# Patient Record
Sex: Male | Born: 1970 | Race: Black or African American | Hispanic: No | Marital: Married | Smoking: Current every day smoker
Health system: Southern US, Community
[De-identification: ages and names within clinical notes are randomized; demographics above are authoritative.]

## PROBLEM LIST (undated history)

## (undated) HISTORY — PX: OTHER SURGICAL HISTORY: SHX169

---

## 2020-01-31 ENCOUNTER — Emergency Department (HOSPITAL_COMMUNITY)
Admission: EM | Admit: 2020-01-31 | Discharge: 2020-01-31 | Disposition: A | Discharge status: Home / Self Care | Payer: Medicaid - Out of State | Attending: Emergency Medicine | Admitting: Emergency Medicine

## 2020-01-31 ENCOUNTER — Encounter (HOSPITAL_COMMUNITY): Payer: Self-pay

## 2020-01-31 ENCOUNTER — Emergency Department (HOSPITAL_COMMUNITY): Payer: Medicaid - Out of State

## 2020-01-31 ENCOUNTER — Other Ambulatory Visit: Payer: Self-pay

## 2020-01-31 DIAGNOSIS — M25561 Pain in right knee: Secondary | ICD-10-CM | POA: Insufficient documentation

## 2020-01-31 DIAGNOSIS — F17200 Nicotine dependence, unspecified, uncomplicated: Secondary | ICD-10-CM | POA: Insufficient documentation

## 2020-01-31 MED ORDER — MELOXICAM 15 MG PO TABS
15.0000 mg | ORAL_TABLET | Freq: Once | ORAL | Status: AC
  Administered 2020-01-31: 15 mg via ORAL
  Filled 2020-01-31: qty 1

## 2020-01-31 MED ORDER — MELOXICAM 15 MG PO TABS: 15.0000 mg | ORAL_TABLET | Freq: Every day | ORAL | 0 refills | Status: DC

## 2020-01-31 MED ORDER — HYDROCODONE-ACETAMINOPHEN 5-325 MG PO TABS
1.0000 | ORAL_TABLET | Freq: Once | ORAL | Status: DC
  Filled 2020-01-31: qty 1

## 2020-01-31 MED ORDER — HYDROCODONE-ACETAMINOPHEN 5-325 MG PO TABS: 1.0000 | ORAL_TABLET | ORAL | 0 refills | Status: DC | PRN

## 2020-01-31 NOTE — ED Provider Notes (Signed)
[facility]-EMERGENCY DEPT Provider Note   CSN: [id number] Arrival date & time: 01/31/20  0840    History Chief Complaint  Patient presents with   Knee Pain    Mathew Huerta is a 49 y.o. male with past medical history who presents for evaluation of knee pain.  Patient with knee pain began greater than 1 month ago.  Patient states this has worsened over the last 3 weeks.  Has noticed intermittent swelling to his right knee.  It does stand on his feet and do a lot of bending.  States he primarily follows in [place] however is here visiting a friend.  No history of urinary complaints, IV drug use.  No fever, chills, nausea, vomiting.  He has not had any surrounding erythema or warmth.  He has been ambulatory without difficulty.  Pain located to lateral aspect knee.  No pain to popliteal fossa.  Does not extend into his calf.  Is not noticed any unilateral leg swelling, redness or warmth.  No prior history of PE or DVT.  No chest pain or shortness of breath.  Patient states he was previously seen by orthopedics and told he had arthritis.  He has been taking Tylenol at home without relief of his pain.  Was unable to sleep last night due to the "aching" so he presented here to emergency department for evaluation.  Unsure of history of gout.  Rates his pain a 8/10.  Denies additional aggravating or relieving factors.  History obtained from patient and past medical records.  No interpreter used.  HPI     History reviewed. No pertinent past medical history.  There are no problems to display for this patient.   History reviewed. No pertinent surgical history.     History reviewed. No pertinent family history.  Social History   Tobacco Use   Smoking status: Current Every Day Smoker   Smokeless tobacco: Never Used   Tobacco comment: x1 pack week  Substance Use Topics   Alcohol use: Not Currently   Drug use: Never    Home Medications Prior to Admission  medications   Medication Sig Start Date End Date Taking? Authorizing Provider  HYDROcodone-acetaminophen (NORCO/VICODIN) 5-325 MG tablet Take 1 tablet by mouth every 4 (four) hours as needed. 01/31/20   Kayl Stogdill A, PA-C  meloxicam (MOBIC) 15 MG tablet Take 1 tablet (15 mg total) by mouth daily. 01/31/20   Arron Tetrault A, PA-C    Allergies    Codeine, Peanut-containing drug products, and Tomato  Review of Systems   Review of Systems  Constitutional: Negative.   HENT: Negative.   Respiratory: Negative.   Cardiovascular: Negative.   Gastrointestinal: Negative.   Genitourinary: Negative.   Musculoskeletal: Negative for gait problem.       Right knee pain  Skin: Negative.   Neurological: Negative.   All other systems reviewed and are negative.   Physical Exam Updated Vital Signs BP (!) 176/96    Pulse 71    Temp 97.8 F (36.6 C) (Oral)    Resp 19    Ht 5\' 11"  (1.803 m)    Wt (!) 140.2 kg    SpO2 98%    BMI 43.10 kg/m   Physical Exam Vitals and nursing note reviewed.  Constitutional:      General: He is not in acute distress.    Appearance: He is well-developed. He is not ill-appearing, toxic-appearing or diaphoretic.  HENT:     Head: Normocephalic and atraumatic.  Nose: Nose normal.     Mouth/Throat:     Mouth: Mucous membranes are moist.  Eyes:     Pupils: Pupils are equal, round, and reactive to light.  Cardiovascular:     Rate and Rhythm: Normal rate and regular rhythm.     Pulses: Normal pulses.          Dorsalis pedis pulses are 2+ on the right side and 2+ on the left side.       Posterior tibial pulses are 2+ on the right side and 2+ on the left side.     Heart sounds: Normal heart sounds.  Pulmonary:     Effort: Pulmonary effort is normal. No respiratory distress.     Breath sounds: Normal breath sounds.  Abdominal:     General: Bowel sounds are normal. There is no distension.     Palpations: Abdomen is soft.  Musculoskeletal:        General:  Normal range of motion.     Cervical back: Normal range of motion and neck supple.     Right upper leg: Normal.     Left upper leg: Normal.     Right knee: Effusion present.     Right lower leg: Normal.     Left lower leg: Normal.       Legs:     Comments: Pelvis stable, nontender palpation.  Tenderness to medial lateral joint line to right knee.  There is mild suprapatellar joint effusion.  Able to flex and extend without difficulty.  No bony tenderness.  Able to straight leg raise bilaterally.  No tenderness to popliteal fossa.  Compartments soft.  Denna Haggard' sign negative.  Calf nontender without edema, erythema or warmth.  Skin:    General: Skin is warm and dry.     Capillary Refill: Capillary refill takes less than 2 seconds.     Comments: Suprapatellar joint effusion to right knee.  No erythema or warmth.  No rashes or lesions.  Neurological:     Mental Status: He is alert.     Cranial Nerves: Cranial nerves are intact.     Sensory: Sensation is intact.     Motor: Motor function is intact.     Coordination: Coordination is intact.     Gait: Gait is intact.     Comments: Ambulatory that difficulty 5/5 strength bilaterally Intact sensation    ED Results / Procedures / Treatments   Labs (all labs ordered are listed, but only abnormal results are displayed) Labs Reviewed - No data to display  EKG None  Radiology DG Knee Complete 4 Views Right  Result Date: 01/31/2020 CLINICAL DATA:  Knee pain for 2 weeks.  No injury. EXAM: RIGHT KNEE - COMPLETE 4+ VIEW COMPARISON:  None. FINDINGS: No evidence of fracture, dislocation. Suprapatellar effusion is noted. Mild narrow femoral tibial joint space is identified. Soft tissues are unremarkable. IMPRESSION: No acute fracture or dislocation noted. 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 Electronically Signed   By: Sherian Rein M.D.   On: 01/31/2020 10:08    Procedures Procedures (including critical care time)  Medications  Ordered in ED Medications  HYDROcodone-acetaminophen (NORCO/VICODIN) 5-325 MG per tablet 1 tablet (1 tablet Oral Refused 01/31/20 1150)  meloxicam (MOBIC) tablet 15 mg (15 mg Oral Given 01/31/20 1150)    ED Course  I have reviewed the triage vital signs and the nursing notes.  Pertinent labs & imaging results that were available during my care of the patient were reviewed by  me and considered in my medical decision making (see chart for details).  49 year old presents for evaluation of right knee pain.  He is afebrile, nonseptic, not ill-appearing.  Has history of chronic right knee pain was told previously this was due to arthritis.  Has noticed some swelling to his right knee over the last 3 weeks.  No recent injury or trauma.  No associated redness or warmth.  He is neurovascularly intact.  Compartments soft.  No clinical evidence of DVT on exam.  Patient does have tenderness to medial lateral joint line.  He has full range of motion to bilateral knees without any increased pain.  He does not have history of IV drug use, urinary complaints, history of gout.  Plain film obtained from triage did not show evidence of fracture, dislocation.  Does show small suprapatellar joint effusion which is consistent with exam.  Given length of time exam and history feel symptoms are most likely related to arthritis, overuse.  Have low suspicion for septic joint, gout, hemarthrosis, VTE, myositis, fracture, dislocation, rhabdomyolysis.  Will treat symptomatically.  Distress importance of following up with orthopedics given this has been an ongoing issue.  I did give him resources for orthopedics here given he will be here for the next week and a half however patient states after that he will be returning to [place].  Again encouraged to follow-up outpatient return for new or worsening symptoms.  The patient has been appropriately medically screened and/or stabilized in the ED. I have low suspicion for any other  emergent medical condition which would require further screening, evaluation or treatment in the ED or require inpatient management.  Patient is hemodynamically stable and in no acute distress.  Patient able to ambulate in department prior to ED.  Evaluation does not show acute pathology that would require ongoing or additional emergent interventions while in the emergency department or further inpatient treatment.  I have discussed the diagnosis with the patient and answered all questions.  Pain is been managed while in the emergency department and patient has no further complaints prior to discharge.  Patient is comfortable with plan discussed in room and is stable for discharge at this time.  I have discussed strict return precautions for returning to the emergency department.  Patient was encouraged to follow-up with PCP/specialist refer to at discharge.    MDM Rules/Calculators/A&P                           Final Clinical Impression(s) / ED Diagnoses Final diagnoses:  Right knee pain, unspecified chronicity    Rx / DC Orders ED Discharge Orders         Ordered    meloxicam (MOBIC) 15 MG tablet  Daily        01/31/20 1220    HYDROcodone-acetaminophen (NORCO/VICODIN) 5-325 MG tablet  Every 4 hours PRN        01/31/20 1220           Natsumi Whitsitt A, PA-C 01/31/20 1324    Bethann Berkshire, MD 02/05/20 1056

## 2020-01-31 NOTE — ED Triage Notes (Addendum)
Pt arrived via walk in, c/o right knee swelling and pain. States worsening x2 weeks. States this has been an ongoing issue, has been seen in the past and told her has arthritis. Denies any trauma to area

## 2020-01-31 NOTE — Discharge Instructions (Signed)
Follow Up with orthopedics  Return for any worsening symptoms

## 2020-02-04 ENCOUNTER — Encounter (HOSPITAL_COMMUNITY): Payer: Self-pay | Admitting: *Deleted

## 2020-02-04 ENCOUNTER — Emergency Department (HOSPITAL_COMMUNITY)
Admission: EM | Admit: 2020-02-04 | Discharge: 2020-02-04 | Disposition: A | Discharge status: Home / Self Care | Payer: Medicaid - Out of State | Attending: Emergency Medicine | Admitting: Emergency Medicine

## 2020-02-04 ENCOUNTER — Other Ambulatory Visit: Payer: Self-pay

## 2020-02-04 DIAGNOSIS — G8929 Other chronic pain: Secondary | ICD-10-CM | POA: Insufficient documentation

## 2020-02-04 DIAGNOSIS — F17200 Nicotine dependence, unspecified, uncomplicated: Secondary | ICD-10-CM | POA: Diagnosis not present

## 2020-02-04 DIAGNOSIS — Z91010 Allergy to peanuts: Secondary | ICD-10-CM | POA: Diagnosis not present

## 2020-02-04 DIAGNOSIS — M25561 Pain in right knee: Secondary | ICD-10-CM | POA: Insufficient documentation

## 2020-02-04 NOTE — ED Notes (Signed)
Pt ambulatory from triage to room 

## 2020-02-04 NOTE — ED Triage Notes (Signed)
Pt returns with continued pain in rt knee, swelling, was told he has to be checked out again in order to return to work. Seen on 10/10 for same.

## 2020-02-04 NOTE — Progress Notes (Signed)
Orthopedic Tech Progress Note Patient Details:  Mathew Huerta 1970/07/29 [id number]  Ortho Devices Type of Ortho Device: Knee Sleeve Ortho Device/Splint Location: RLE Ortho Device/Splint Interventions: Ordered, Application   Post Interventions Patient Tolerated: Well Instructions Provided: Care of device   Kerry Fort 02/04/2020, 12:08 PM

## 2020-02-04 NOTE — ED Provider Notes (Signed)
[facility] EMERGENCY DEPARTMENT Provider Note  CSN: [id number] Arrival date & time: 02/04/20 2202    History Chief Complaint  Patient presents with  . Knee Pain    HPI  Mathew Huerta is a 49 y.o. male patient with history of R knee pain ongoing for several weeks, worse in the last few days. He was seen in this ED 4 days ago and had a neg xray then. He was given Rx for Mobic and Hydrocodone with minimal improvement. He was seen limping at work last night and told he needed to come back to the ED to be cleared to work. He denies any new injuries, fevers, or other acute change.    History reviewed. No pertinent past medical history.  Past Surgical History:  Procedure Laterality Date  . arm surgery      No family history on file.  Social History   Tobacco Use  . Smoking status: Current Every Day Smoker  . Smokeless tobacco: Never Used  . Tobacco comment: x1 pack week  Substance Use Topics  . Alcohol use: Not Currently  . Drug use: Never     Home Medications Prior to Admission medications   Medication Sig Start Date End Date Taking? Authorizing Provider  HYDROcodone-acetaminophen (NORCO/VICODIN) 5-325 MG tablet Take 1 tablet by mouth every 4 (four) hours as needed. 01/31/20   Henderly, Britni A, PA-C  meloxicam (MOBIC) 15 MG tablet Take 1 tablet (15 mg total) by mouth daily. 01/31/20   Henderly, Britni A, PA-C     Allergies    Bl milk of [magnesium hydroxide], Codeine, Peanut-containing drug products, and Tomato   Review of Systems   Review of Systems A comprehensive review of systems was completed and negative except as noted in HPI.    Physical Exam BP (!) 171/104 (BP Location: Left Arm)   Pulse 80   Temp 98.3 F (36.8 C) (Oral)   Resp 18   Ht 5\' 11"  (1.803 m)   Wt (!) 140.2 kg   SpO2 96%   BMI 43.10 kg/m   Physical Exam Vitals and nursing note reviewed.  HENT:     Head: Normocephalic.     Nose: Nose normal.  Eyes:     Extraocular Movements:  Extraocular movements intact.  Pulmonary:     Effort: Pulmonary effort is normal.  Musculoskeletal:        General: Normal range of motion.     Cervical back: Neck supple.     Comments: Normal ROM, knee is benign no signs of infection or laxity, small effusion  Skin:    Findings: No rash (on exposed skin).  Neurological:     Mental Status: He is alert and oriented to person, place, and time.  Psychiatric:        Mood and Affect: Mood normal.      ED Results / Procedures / Treatments   Labs (all labs ordered are listed, but only abnormal results are displayed) Labs Reviewed - No data to display  EKG None  Radiology No results found.  Procedures Procedures  Medications Ordered in the ED Medications - No data to display   MDM Rules/Calculators/A&P MDM Patient states he wants to work and needs a note clearing him. He was referred to Ortho at his last visit but has not yet made an appointment because he is from [place]. He will be here for about 2 more weeks and encouraged to call for an appointment as he would likely benefit from steroid injection.  He is not currently wearing any support so we will fit him for a knee sleeve.  ED Course  I have reviewed the triage vital signs and the nursing notes.  Pertinent labs & imaging results that were available during my care of the patient were reviewed by me and considered in my medical decision making (see chart for details).     Final Clinical Impression(s) / ED Diagnoses Final diagnoses:  Chronic pain of right knee    Rx / DC Orders ED Discharge Orders    None       Pollyann Savoy, MD 02/04/20 [phone]

## 2020-03-02 ENCOUNTER — Encounter (HOSPITAL_COMMUNITY): Payer: Self-pay

## 2020-03-02 ENCOUNTER — Emergency Department (HOSPITAL_COMMUNITY): Payer: Medicaid - Out of State

## 2020-03-02 ENCOUNTER — Emergency Department (HOSPITAL_COMMUNITY)
Admission: EM | Admit: 2020-03-02 | Discharge: 2020-03-02 | Disposition: A | Discharge status: Home / Self Care | Payer: Medicaid - Out of State | Attending: Emergency Medicine | Admitting: Emergency Medicine

## 2020-03-02 ENCOUNTER — Other Ambulatory Visit: Payer: Self-pay

## 2020-03-02 DIAGNOSIS — Z20822 Contact with and (suspected) exposure to covid-19: Secondary | ICD-10-CM | POA: Diagnosis not present

## 2020-03-02 DIAGNOSIS — F17200 Nicotine dependence, unspecified, uncomplicated: Secondary | ICD-10-CM | POA: Diagnosis not present

## 2020-03-02 DIAGNOSIS — M25461 Effusion, right knee: Secondary | ICD-10-CM | POA: Diagnosis not present

## 2020-03-02 DIAGNOSIS — M791 Myalgia, unspecified site: Secondary | ICD-10-CM | POA: Diagnosis not present

## 2020-03-02 DIAGNOSIS — Z91010 Allergy to peanuts: Secondary | ICD-10-CM | POA: Diagnosis not present

## 2020-03-02 DIAGNOSIS — R197 Diarrhea, unspecified: Secondary | ICD-10-CM | POA: Diagnosis not present

## 2020-03-02 LAB — CBC
HCT: 41.7 % (ref 39.0–52.0)
Hemoglobin: 13.4 g/dL (ref 13.0–17.0)
MCH: 27.3 pg (ref 26.0–34.0)
MCHC: 32.1 g/dL (ref 30.0–36.0)
MCV: 85.1 fL (ref 80.0–100.0)
Platelets: 300 10*3/uL (ref 150–400)
RBC: 4.9 MIL/uL (ref 4.22–5.81)
RDW: 14.6 % (ref 11.5–15.5)
WBC: 7.1 10*3/uL (ref 4.0–10.5)
nRBC: 0 % (ref 0.0–0.2)

## 2020-03-02 LAB — COMPREHENSIVE METABOLIC PANEL
ALT: 24 U/L (ref 0–44)
AST: 22 U/L (ref 15–41)
Albumin: 3.8 g/dL (ref 3.5–5.0)
Alkaline Phosphatase: 76 U/L (ref 38–126)
Anion gap: 7 (ref 5–15)
BUN: 12 mg/dL (ref 6–20)
CO2: 27 mmol/L (ref 22–32)
Calcium: 9 mg/dL (ref 8.9–10.3)
Chloride: 105 mmol/L (ref 98–111)
Creatinine, Ser: 0.92 mg/dL (ref 0.61–1.24)
GFR, Estimated: >60 mL/min (ref >60)
Glucose, Bld: 110 mg/dL — ABNORMAL HIGH (ref 70–99)
Potassium: 4.3 mmol/L (ref 3.5–5.1)
Sodium: 139 mmol/L (ref 135–145)
Total Bilirubin: 0.3 mg/dL (ref 0.3–1.2)
Total Protein: 7.4 g/dL (ref 6.5–8.1)

## 2020-03-02 LAB — CBG MONITORING, ED: Glucose-Capillary: 90 mg/dL (ref 70–99)

## 2020-03-02 LAB — LIPASE, BLOOD: Lipase: 32 U/L (ref 11–51)

## 2020-03-02 LAB — RESPIRATORY PANEL BY RT PCR (FLU A&B, COVID)
Influenza A by PCR: NEGATIVE
Influenza B by PCR: NEGATIVE
SARS Coronavirus 2 by RT PCR: NEGATIVE

## 2020-03-02 MED ORDER — HYDROCODONE-ACETAMINOPHEN 5-325 MG PO TABS: 1.0000 | ORAL_TABLET | Freq: Four times a day (QID) | ORAL | 0 refills | Status: DC | PRN

## 2020-03-02 MED ORDER — SODIUM CHLORIDE 0.9 % IV BOLUS
1000.0000 mL | Freq: Once | INTRAVENOUS | Status: AC
  Administered 2020-03-02: 1000 mL via INTRAVENOUS

## 2020-03-02 MED ORDER — PREDNISONE 10 MG PO TABS: 20.0000 mg | ORAL_TABLET | Freq: Every day | ORAL | 0 refills | Status: DC

## 2020-03-02 MED ORDER — KETOROLAC TROMETHAMINE 30 MG/ML IJ SOLN
30.0000 mg | Freq: Once | INTRAMUSCULAR | Status: AC
  Administered 2020-03-02: 30 mg via INTRAVENOUS
  Filled 2020-03-02: qty 1

## 2020-03-02 NOTE — ED Notes (Signed)
Pt given work excuse and bus pass

## 2020-03-02 NOTE — Discharge Instructions (Signed)
Follow-up with your family doctor if continued diarrhea.  And follow-up with your orthopedic doctor for your knee

## 2020-03-02 NOTE — ED Provider Notes (Signed)
[facility]-EMERGENCY DEPT Provider Note   CSN: [id number] Arrival date & time: 03/02/20  1309     History Chief Complaint  Patient presents with  . Generalized Body Aches  . Diarrhea  . Dizziness    Mathew Huerta is a 49 y.o. male.  Patient complains of diarrhea and body aches.  Also has some swelling to his right knee that happened before and he was referred to orthopedics at that time.  Prednisone seemed to help that knee.  The history is provided by the patient and medical records. No language interpreter was used.  Diarrhea Quality:  Mucous Severity:  Moderate Onset quality:  Sudden Timing:  Constant Progression:  Improving Relieved by:  Nothing Worsened by:  Nothing Ineffective treatments:  None tried Associated symptoms: no abdominal pain and no headaches   Dizziness Associated symptoms: diarrhea   Associated symptoms: no chest pain and no headaches        History reviewed. No pertinent past medical history.  There are no problems to display for this patient.   Past Surgical History:  Procedure Laterality Date  . arm surgery         History reviewed. No pertinent family history.  Social History   Tobacco Use  . Smoking status: Current Every Day Smoker  . Smokeless tobacco: Never Used  . Tobacco comment: x1 pack week  Substance Use Topics  . Alcohol use: Not Currently  . Drug use: Never    Home Medications Prior to Admission medications   Medication Sig Start Date End Date Taking? Authorizing Provider  folic acid (FOLVITE) 1 MG tablet Take 1 mg by mouth daily. 11/25/19  Yes [provider]  ibuprofen (ADVIL) 200 MG tablet Take 200-400 mg by mouth every 6 (six) hours as needed for moderate pain.   Yes [provider]  omeprazole (PRILOSEC OTC) 20 MG tablet Take 20 mg by mouth daily.   Yes [provider]  HYDROcodone-acetaminophen (NORCO/VICODIN) 5-325 MG tablet Take 1 tablet by mouth every 6 (six)  hours as needed for moderate pain. 03/02/20   Bethann Berkshire, MD  meloxicam (MOBIC) 15 MG tablet Take 1 tablet (15 mg total) by mouth daily. Patient not taking: Reported on 03/02/2020 01/31/20   Henderly, Britni A, PA-C  predniSONE (DELTASONE) 10 MG tablet Take 2 tablets (20 mg total) by mouth daily. 03/02/20   Bethann Berkshire, MD    Allergies    Bl milk of [magnesium hydroxide], Codeine, Peanut-containing drug products, and Tomato  Review of Systems   Review of Systems  Constitutional: Negative for appetite change and fatigue.  HENT: Negative for congestion, ear discharge and sinus pressure.   Eyes: Negative for discharge.  Respiratory: Negative for cough.   Cardiovascular: Negative for chest pain.  Gastrointestinal: Positive for diarrhea. Negative for abdominal pain.  Genitourinary: Negative for frequency and hematuria.  Musculoskeletal: Negative for back pain.  Skin: Negative for rash.  Neurological: Positive for dizziness. Negative for seizures and headaches.  Psychiatric/Behavioral: Negative for hallucinations.    Physical Exam Updated Vital Signs BP (!) 179/97   Pulse 62   Temp 98.5 F (36.9 C) (Oral)   Resp 18   SpO2 96%   Physical Exam Vitals and nursing note reviewed.  Constitutional:      Appearance: He is well-developed.  HENT:     Head: Normocephalic.     Nose: Nose normal.  Eyes:     General: No scleral icterus.    Conjunctiva/sclera: Conjunctivae normal.  Neck:  Thyroid: No thyromegaly.  Cardiovascular:     Rate and Rhythm: Normal rate and regular rhythm.     Heart sounds: No murmur heard.  No friction rub. No gallop.   Pulmonary:     Breath sounds: No stridor. No wheezing or rales.  Chest:     Chest wall: No tenderness.  Abdominal:     General: There is no distension.     Tenderness: There is no abdominal tenderness. There is no rebound.  Musculoskeletal:     Cervical back: Neck supple.     Comments: Mild swelling right knee   Lymphadenopathy:     Cervical: No cervical adenopathy.  Skin:    Findings: No erythema or rash.  Neurological:     Mental Status: He is alert and oriented to person, place, and time.     Motor: No abnormal muscle tone.     Coordination: Coordination normal.  Psychiatric:        Behavior: Behavior normal.     ED Results / Procedures / Treatments   Labs (all labs ordered are listed, but only abnormal results are displayed) Labs Reviewed  COMPREHENSIVE METABOLIC PANEL - Abnormal; Notable for the following components:      Result Value   Glucose, Bld 110 (*)    All other components within normal limits  RESPIRATORY PANEL BY RT PCR (FLU A&B, COVID)  LIPASE, BLOOD  CBC  CBG MONITORING, ED    EKG None  Radiology DG ABD ACUTE 2+V W 1V CHEST  Result Date: 03/02/2020 CLINICAL DATA:  Abdominal pain EXAM: DG ABDOMEN ACUTE WITH 1 VIEW CHEST COMPARISON:  None. FINDINGS: There is no evidence of dilated bowel loops or free intraperitoneal air. No radiopaque calculi or other significant radiographic abnormality is seen. Heart size and mediastinal contours are mildly enlarged. Streaky atelectasis seen within the left mid lung. IMPRESSION: Negative abdominal radiographs. Streaky atelectasis in the left mid lung. Electronically Signed   By: Jonna Clark M.D.   On: 03/02/2020 16:53    Procedures Procedures (including critical care time)  Medications Ordered in ED Medications  sodium chloride 0.9 % bolus 1,000 mL (0 mLs Intravenous Stopped 03/02/20 1755)  ketorolac (TORADOL) 30 MG/ML injection 30 mg (30 mg Intravenous Given 03/02/20 1601)    ED Course  I have reviewed the triage vital signs and the nursing notes.  Pertinent labs & imaging results that were available during my care of the patient were reviewed by me and considered in my medical decision making (see chart for details).    MDM Rules/Calculators/A&P                          Patient with gastroenteritis.  Patient is  stable.  He will be discharged home.  Patient also has inflamed right knee which prednisone has helped before he will be put back on it and referred to Ortho        This patient presents to the ED for concern of diarrhea, this involves an extensive number of treatment options, and is a complaint that carries with it a high risk of complications and morbidity.  The differential diagnosis includes viral gastroenteritis   Lab Tests:   I Ordered, reviewed, and interpreted labs, which included CBC and chemistries which were unremarkable  Medicines ordered:   I ordered medication normal saline for dehydration Toradol for aches  Imaging Studies ordered:   I ordered imaging studies which included abdominal series  I independently visualized  and interpreted imaging which showed negative  Additional history obtained:   Additional history obtained from records  Previous records obtained and reviewed.  Consultations Obtained:   ging findings  Reevaluation:  After the interventions stated above, I reevaluated the patient and found improved  Critical Interventions:  .   Final Clinical Impression(s) / ED Diagnoses Final diagnoses:  None    Rx / DC Orders ED Discharge Orders         Ordered    predniSONE (DELTASONE) 10 MG tablet  Daily        03/02/20 1817    HYDROcodone-acetaminophen (NORCO/VICODIN) 5-325 MG tablet  Every 6 hours PRN        03/02/20 1817           Bethann Berkshire, MD 03/02/20 1820

## 2020-03-02 NOTE — ED Triage Notes (Addendum)
Pt presents with c/o diarrhea, generalized body aches, and headache. Pt also c/o right knee swelling, has been seen for same approx one month ago. Pt also c/o dizziness.

## 2020-04-12 ENCOUNTER — Emergency Department (HOSPITAL_COMMUNITY)
Admission: EM | Admit: 2020-04-12 | Discharge: 2020-04-13 | Disposition: A | Discharge status: Home / Self Care | Payer: Medicaid - Out of State | Attending: Emergency Medicine | Admitting: Emergency Medicine

## 2020-04-12 ENCOUNTER — Encounter (HOSPITAL_COMMUNITY): Payer: Self-pay | Admitting: Emergency Medicine

## 2020-04-12 DIAGNOSIS — I10 Essential (primary) hypertension: Secondary | ICD-10-CM | POA: Diagnosis not present

## 2020-04-12 DIAGNOSIS — F17200 Nicotine dependence, unspecified, uncomplicated: Secondary | ICD-10-CM | POA: Diagnosis not present

## 2020-04-12 DIAGNOSIS — R531 Weakness: Secondary | ICD-10-CM | POA: Insufficient documentation

## 2020-04-12 DIAGNOSIS — R519 Headache, unspecified: Secondary | ICD-10-CM | POA: Diagnosis present

## 2020-04-12 NOTE — ED Triage Notes (Signed)
Pt reports severe headache and weakness. Reports a hx of HTN and has been unmedicated for about a month. Reports R arm numbness and intermittent blurry vision.

## 2020-04-13 ENCOUNTER — Emergency Department (HOSPITAL_COMMUNITY): Payer: Medicaid - Out of State

## 2020-04-13 LAB — I-STAT CHEM 8, ED
BUN: 13 mg/dL (ref 6–20)
Calcium, Ion: 1.25 mmol/L (ref 1.15–1.40)
Chloride: 104 mmol/L (ref 98–111)
Creatinine, Ser: 1.2 mg/dL (ref 0.61–1.24)
Glucose, Bld: 125 mg/dL — ABNORMAL HIGH (ref 70–99)
HCT: 43 % (ref 39.0–52.0)
Hemoglobin: 14.6 g/dL (ref 13.0–17.0)
Potassium: 3.9 mmol/L (ref 3.5–5.1)
Sodium: 142 mmol/L (ref 135–145)
TCO2: 27 mmol/L (ref 22–32)

## 2020-04-13 MED ORDER — MELOXICAM 7.5 MG PO TABS
7.5000 mg | ORAL_TABLET | Freq: Once | ORAL | Status: AC
  Administered 2020-04-13: 7.5 mg via ORAL
  Filled 2020-04-13: qty 1

## 2020-04-13 MED ORDER — HYDRALAZINE HCL 25 MG PO TABS
50.0000 mg | ORAL_TABLET | Freq: Once | ORAL | Status: AC
  Administered 2020-04-13: 50 mg via ORAL
  Filled 2020-04-13: qty 2

## 2020-04-13 MED ORDER — HYDRALAZINE HCL 50 MG PO TABS: 50.0000 mg | ORAL_TABLET | Freq: Three times a day (TID) | ORAL | 0 refills | Status: AC

## 2020-04-13 NOTE — ED Provider Notes (Signed)
[facility]-EMERGENCY DEPT Provider Note   CSN: [id number] Arrival date & time: 04/12/20  2305     History Chief Complaint  Patient presents with   Headache   Weakness    Mathew Huerta is a 49 y.o. male.   Headache Pain location:  Generalized Quality:  Dull Radiates to:  Does not radiate Timing:  Constant Progression:  Unchanged Chronicity:  Recurrent Similar to prior headaches: yes   Associated symptoms: weakness   Weakness Associated symptoms: headaches        History reviewed. No pertinent past medical history.  There are no problems to display for this patient.   Past Surgical History:  Procedure Laterality Date   arm surgery         History reviewed. No pertinent family history.  Social History   Tobacco Use   Smoking status: Current Every Day Smoker   Smokeless tobacco: Never Used   Tobacco comment: x1 pack week  Substance Use Topics   Alcohol use: Not Currently   Drug use: Never    Home Medications Prior to Admission medications   Medication Sig Start Date End Date Taking? Authorizing Provider  ibuprofen (ADVIL) 200 MG tablet Take 200-400 mg by mouth every 6 (six) hours as needed for moderate pain.   Yes [provider]  PRESCRIPTION MEDICATION Take 1 tablet by mouth daily.   Yes [provider]  hydrALAZINE (APRESOLINE) 50 MG tablet Take 1 tablet (50 mg total) by mouth every 8 (eight) hours. 04/13/20   Beulah Matusek, Barbara Cower, MD    Allergies    Bl milk of [magnesium hydroxide], Codeine, Peanut-containing drug products, and Tomato  Review of Systems   Review of Systems  Neurological: Positive for weakness and headaches.  All other systems reviewed and are negative.   Physical Exam Updated Vital Signs BP (!) 146/85    Pulse 85    Temp 98.2 F (36.8 C) (Oral)    Resp 16    SpO2 92%   Physical Exam Vitals and nursing note reviewed.  Constitutional:      Appearance: He is well-developed and  well-nourished.  HENT:     Head: Normocephalic and atraumatic.  Eyes:     General: No visual field deficit. Cardiovascular:     Rate and Rhythm: Normal rate.  Pulmonary:     Effort: Pulmonary effort is normal. No respiratory distress.  Abdominal:     General: There is no distension.  Musculoskeletal:        General: Normal range of motion.     Cervical back: Normal range of motion.  Neurological:     Mental Status: He is alert and oriented to person, place, and time. Mental status is at baseline.     GCS: GCS eye subscore is 4. GCS verbal subscore is 5. GCS motor subscore is 6.     Cranial Nerves: No cranial nerve deficit, dysarthria or facial asymmetry.     Sensory: No sensory deficit.     Motor: No weakness.     Coordination: Romberg sign negative. Coordination normal.     Deep Tendon Reflexes: Reflexes normal.     ED Results / Procedures / Treatments   Labs (all labs ordered are listed, but only abnormal results are displayed) Labs Reviewed  I-STAT CHEM 8, ED    EKG None  Radiology CT Head Wo Contrast  Result Date: 04/13/2020 CLINICAL DATA:  Headache and weakness, noncompliant with antihypertensives EXAM: CT HEAD WITHOUT CONTRAST TECHNIQUE: Contiguous axial images were  obtained from the base of the skull through the vertex without intravenous contrast. COMPARISON:  None. FINDINGS: Brain: No evidence of acute infarction, hemorrhage, hydrocephalus, extra-axial collection, visible mass lesion or mass effect. Basal cisterns are patent. Midline intracranial structures are unremarkable. Cerebellar tonsils are normally positioned. Vascular: No hyperdense vessel or unexpected calcification. Skull: No calvarial fracture or suspicious osseous lesion. No scalp swelling or hematoma. Sinuses/Orbits: Paranasal sinuses and mastoid air cells are predominantly clear. Included orbital structures are unremarkable. Other: None IMPRESSION: No acute intracranial abnormality. Electronically Signed    By: Kreg Shropshire M.D.   On: 04/13/2020 02:13    Procedures Procedures (including critical care time)  Medications Ordered in ED Medications  hydrALAZINE (APRESOLINE) tablet 50 mg (50 mg Oral Given 04/13/20 0051)  meloxicam (MOBIC) tablet 7.5 mg (7.5 mg Oral Given 04/13/20 0251)    ED Course  I have reviewed the triage vital signs and the nursing notes.  Pertinent labs & imaging results that were available during my care of the patient were reviewed by me and considered in my medical decision making (see chart for details).    MDM Rules/Calculators/A&P                          Restarted htn meds. No stroke. Stable for dc w/ pcp follow up.  Final Clinical Impression(s) / ED Diagnoses Final diagnoses:  Hypertension, unspecified type    Rx / DC Orders ED Discharge Orders         Ordered    hydrALAZINE (APRESOLINE) 50 MG tablet  Every 8 hours        04/13/20 0314           Heraclio Seidman, Barbara Cower, MD 04/13/20 [phone]

## 2022-03-28 IMAGING — DX DG ABDOMEN ACUTE W/ 1V CHEST
4 series · 5 of 5 positions shown · non-contrast
Comparison: None.

CLINICAL DATA: Abdominal pain

EXAM:
DG ABDOMEN ACUTE WITH 1 VIEW CHEST

[abdomen erect]
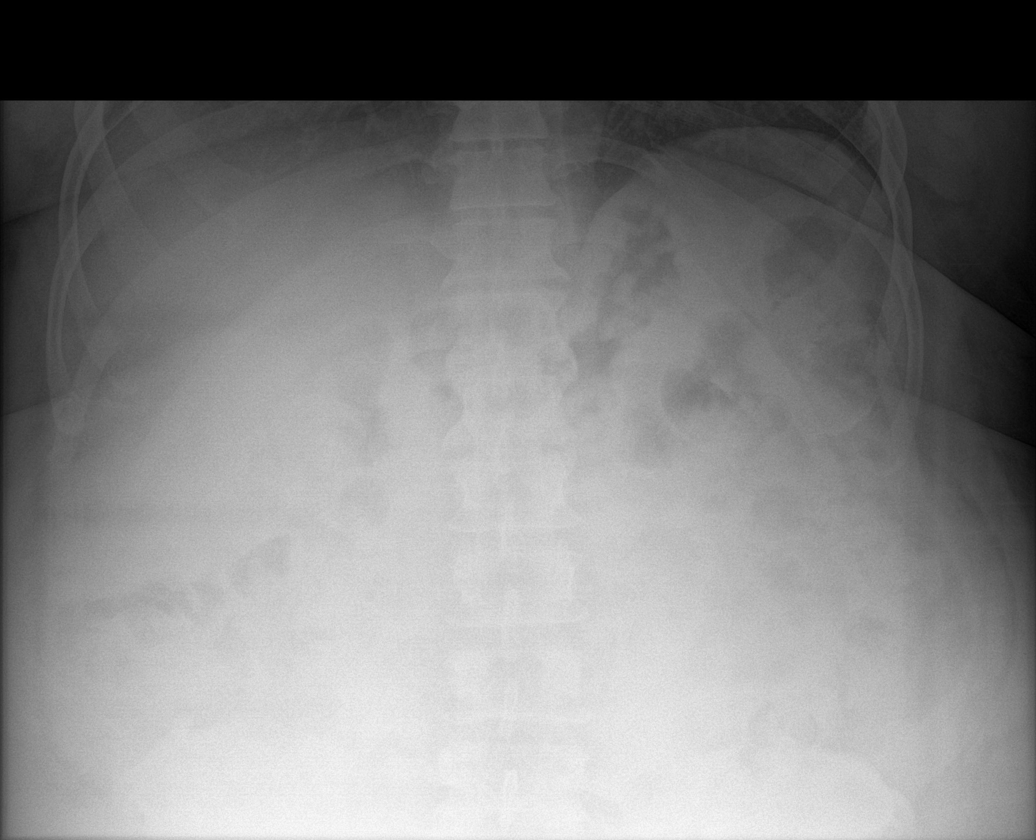

[abdomen supine]
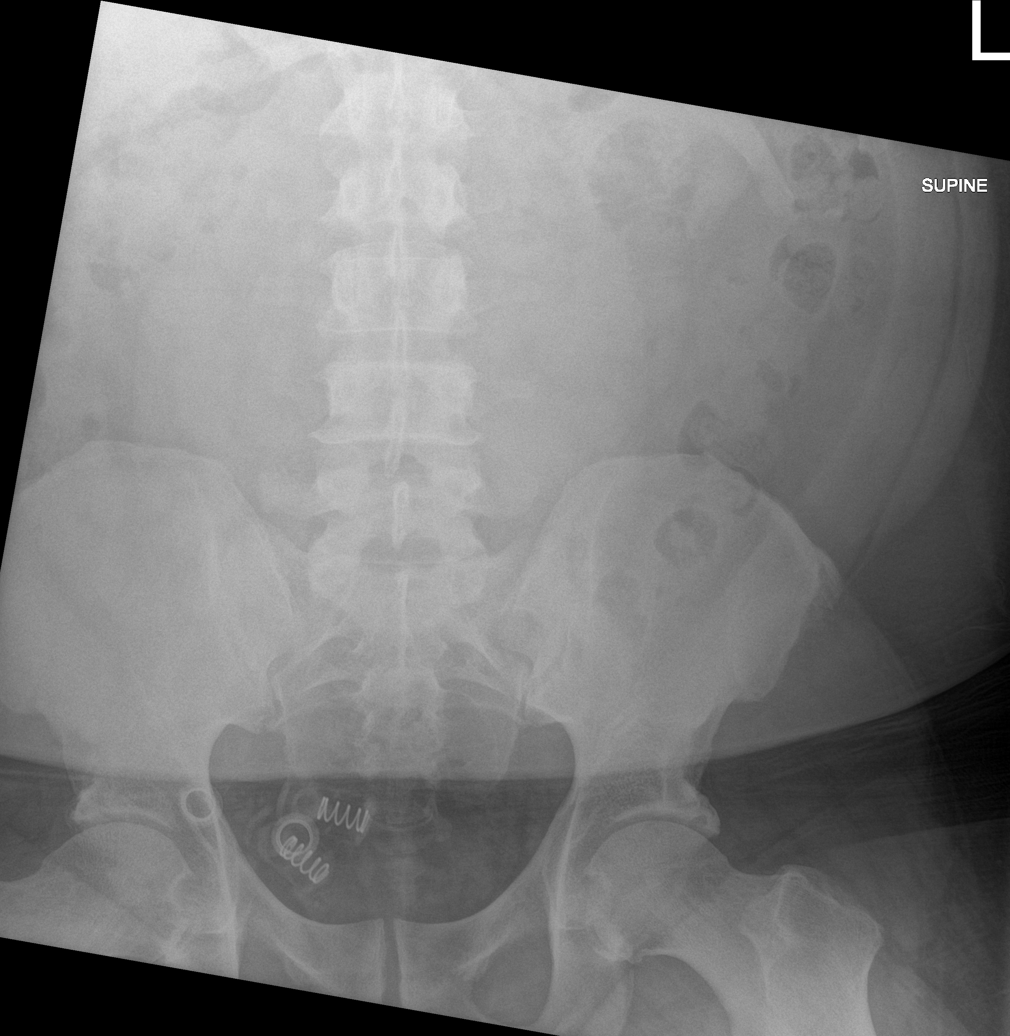

[abdomen decu]
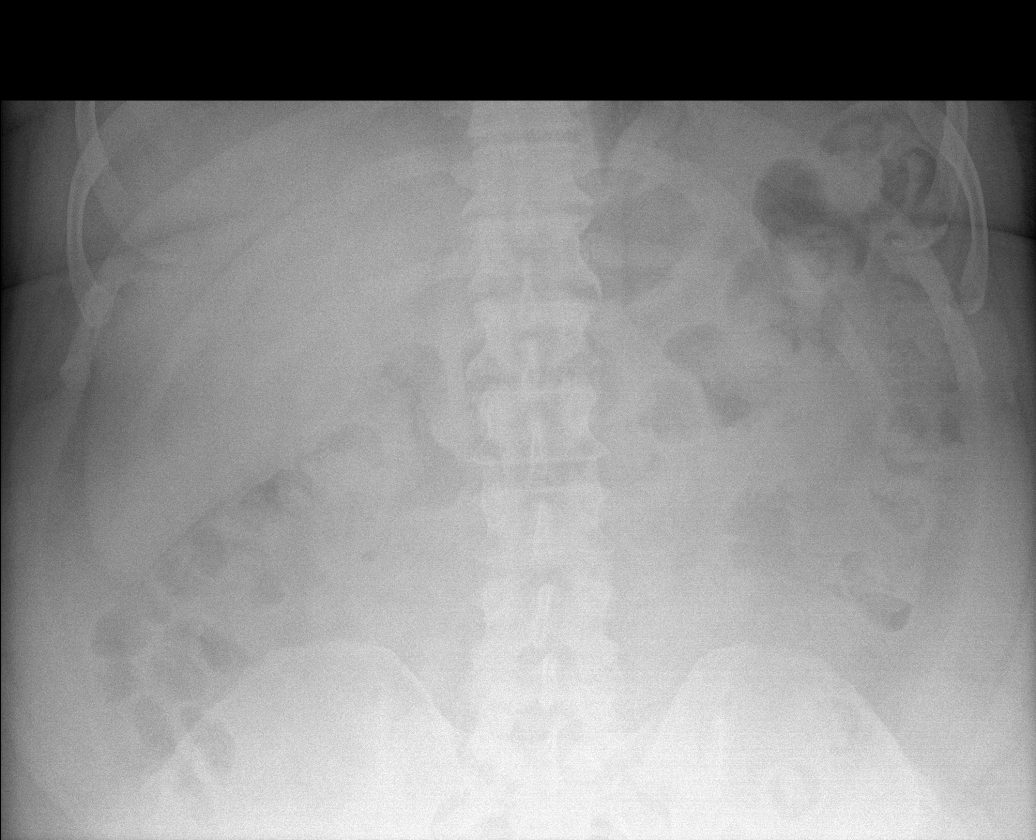

[Series 5: chest ap · 0.14mm/px · 2 of 2 slices shown]
[im 1/2]
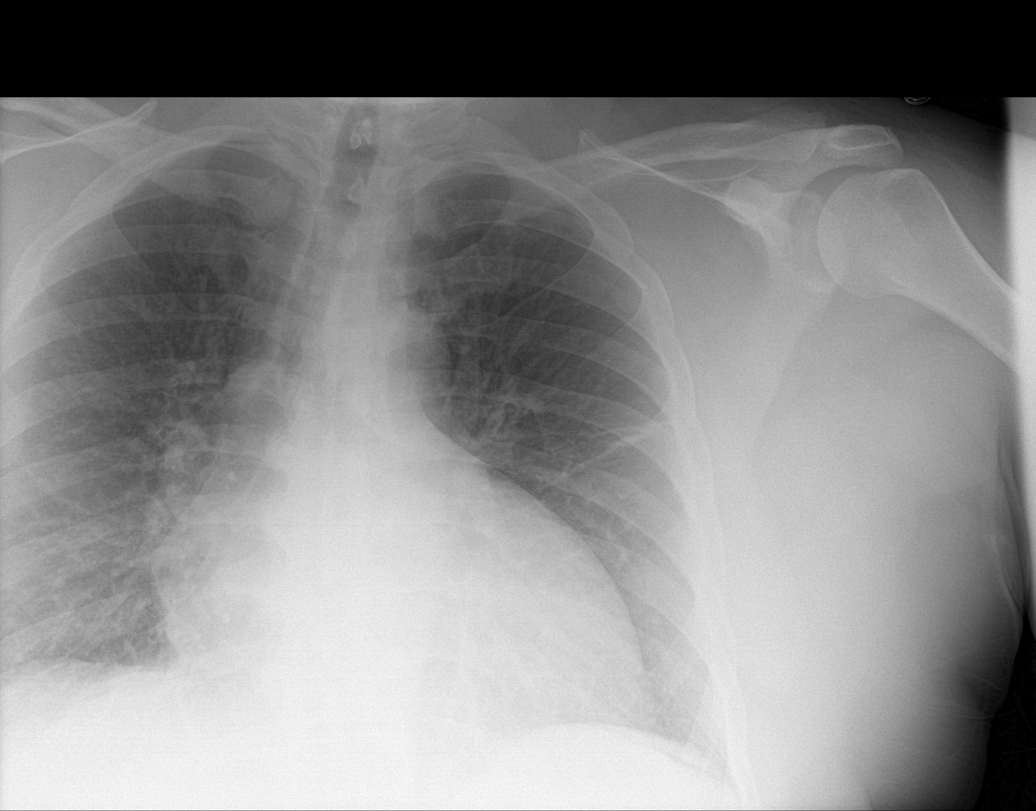
[im 2/2]
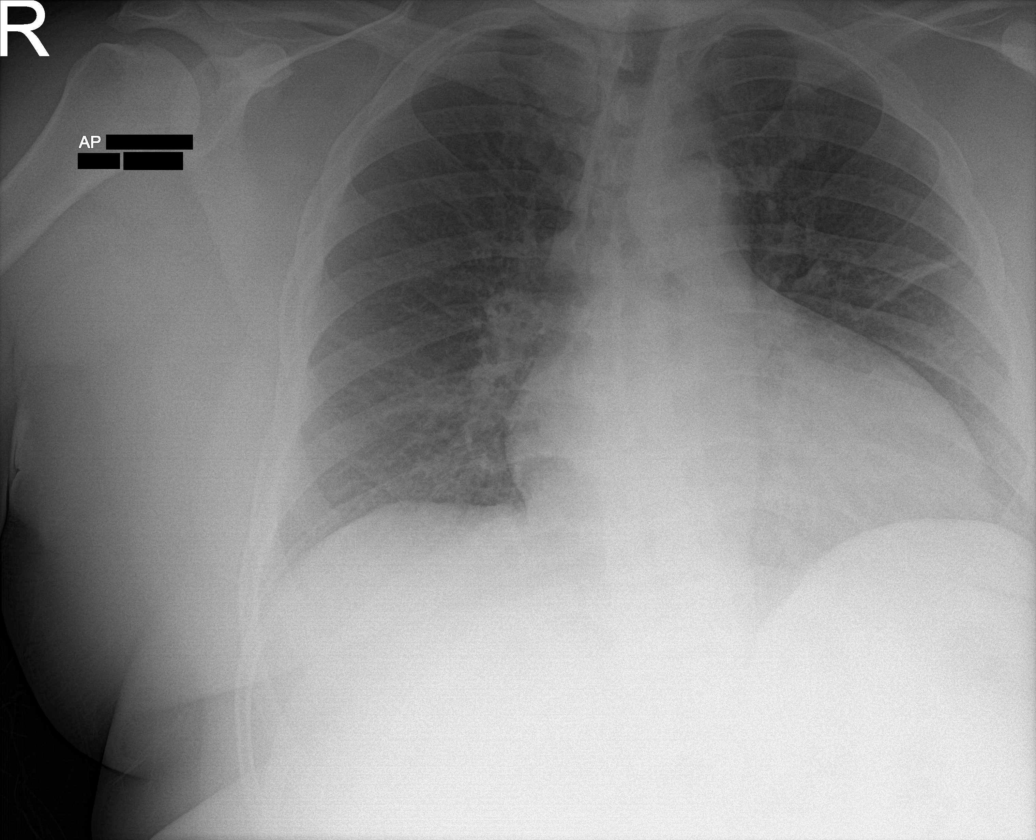

[5 of 5 positions shown; findings below may reference images not displayed]

FINDINGS: There is no evidence of dilated bowel loops or free intraperitoneal
air. No radiopaque calculi or other significant radiographic
abnormality is seen. Heart size and mediastinal contours are mildly
enlarged. Streaky atelectasis seen within the left mid lung.
IMPRESSION: Negative abdominal radiographs. Streaky atelectasis in the left mid
lung.
